# Patient Record
Sex: Female | Born: 1960 | Race: White | Hispanic: No | Smoking: Former smoker
Health system: Southern US, Community
[De-identification: ages and names within clinical notes are randomized; demographics above are authoritative.]

## PROBLEM LIST (undated history)

## (undated) DIAGNOSIS — I10 Essential (primary) hypertension: Secondary | ICD-10-CM

## (undated) DIAGNOSIS — G4730 Sleep apnea, unspecified: Secondary | ICD-10-CM

## (undated) DIAGNOSIS — M5126 Other intervertebral disc displacement, lumbar region: Secondary | ICD-10-CM

## (undated) DIAGNOSIS — E1140 Type 2 diabetes mellitus with diabetic neuropathy, unspecified: Secondary | ICD-10-CM

## (undated) DIAGNOSIS — J309 Allergic rhinitis, unspecified: Secondary | ICD-10-CM

## (undated) DIAGNOSIS — F419 Anxiety disorder, unspecified: Secondary | ICD-10-CM

## (undated) DIAGNOSIS — D509 Iron deficiency anemia, unspecified: Secondary | ICD-10-CM

## (undated) HISTORY — PX: NASAL SINUS SURGERY: SHX719

---

## 2008-02-12 ENCOUNTER — Ambulatory Visit: Payer: Self-pay | Admitting: Internal Medicine

## 2010-06-19 ENCOUNTER — Ambulatory Visit: Payer: Self-pay | Admitting: Anesthesiology

## 2010-06-21 ENCOUNTER — Ambulatory Visit: Payer: Self-pay | Admitting: Otolaryngology

## 2010-06-22 LAB — PATHOLOGY REPORT

## 2017-03-11 ENCOUNTER — Other Ambulatory Visit: Payer: Self-pay | Admitting: Cardiology

## 2017-03-11 DIAGNOSIS — R0602 Shortness of breath: Secondary | ICD-10-CM

## 2017-03-11 DIAGNOSIS — R0789 Other chest pain: Secondary | ICD-10-CM

## 2017-04-08 ENCOUNTER — Other Ambulatory Visit: Payer: Self-pay | Admitting: Cardiology

## 2017-04-08 DIAGNOSIS — R0602 Shortness of breath: Secondary | ICD-10-CM

## 2017-04-08 DIAGNOSIS — R0789 Other chest pain: Secondary | ICD-10-CM

## 2017-04-11 ENCOUNTER — Encounter
Admission: RE | Admit: 2017-04-11 | Discharge: 2017-04-11 | Disposition: A | Discharge status: Home / Self Care | Payer: Managed Care, Other (non HMO) | Source: Ambulatory Visit | Attending: Cardiology | Admitting: Cardiology

## 2017-04-11 DIAGNOSIS — R0602 Shortness of breath: Secondary | ICD-10-CM | POA: Diagnosis present

## 2017-04-11 DIAGNOSIS — R0789 Other chest pain: Secondary | ICD-10-CM | POA: Insufficient documentation

## 2017-04-11 MED ORDER — TECHNETIUM TC 99M TETROFOSMIN IV KIT
30.0000 | PACK | Freq: Once | INTRAVENOUS | Status: AC | PRN
  Administered 2017-04-11: 29.55 via INTRAVENOUS

## 2017-04-11 MED ORDER — REGADENOSON 0.4 MG/5ML IV SOLN
0.4000 mg | Freq: Once | INTRAVENOUS | Status: AC
  Administered 2017-04-11: 0.4 mg via INTRAVENOUS
  Filled 2017-04-11: qty 5

## 2017-04-12 ENCOUNTER — Encounter
Admission: RE | Admit: 2017-04-12 | Discharge: 2017-04-12 | Disposition: A | Discharge status: Home / Self Care | Payer: Managed Care, Other (non HMO) | Source: Ambulatory Visit | Attending: Cardiology | Admitting: Cardiology

## 2017-04-12 DIAGNOSIS — R0602 Shortness of breath: Secondary | ICD-10-CM | POA: Diagnosis not present

## 2017-04-12 LAB — NM MYOCAR MULTI W/SPECT W/WALL MOTION / EF
CHL CUP NUCLEAR SDS: 1
CHL CUP NUCLEAR SSS: 7
Estimated workload: 1 METS
Exercise duration (min): 1 min
Exercise duration (sec): 47 s
LV dias vol: 126 mL (ref 46–106)
LV sys vol: 29 mL
NUC STRESS TID: 0.85
Peak HR: 108 {beats}/min
Rest HR: 79 {beats}/min
SRS: 10

## 2017-04-12 MED ORDER — TECHNETIUM TC 99M TETROFOSMIN IV KIT
30.0600 | PACK | Freq: Once | INTRAVENOUS | Status: AC | PRN
  Administered 2017-04-12: 30.06 via INTRAVENOUS

## 2017-05-10 ENCOUNTER — Other Ambulatory Visit: Payer: Self-pay | Admitting: Nurse Practitioner

## 2017-05-10 DIAGNOSIS — Z1231 Encounter for screening mammogram for malignant neoplasm of breast: Secondary | ICD-10-CM

## 2017-05-29 ENCOUNTER — Ambulatory Visit
Admission: RE | Admit: 2017-05-29 | Discharge: 2017-05-29 | Disposition: A | Discharge status: Home / Self Care | Payer: Managed Care, Other (non HMO) | Source: Ambulatory Visit | Attending: Nurse Practitioner | Admitting: Nurse Practitioner

## 2017-05-29 DIAGNOSIS — Z1231 Encounter for screening mammogram for malignant neoplasm of breast: Secondary | ICD-10-CM | POA: Insufficient documentation

## 2017-09-17 ENCOUNTER — Encounter
Admission: RE | Disposition: A | Discharge status: Home / Self Care | Payer: Self-pay | Source: Ambulatory Visit | Attending: Internal Medicine

## 2017-09-17 ENCOUNTER — Ambulatory Visit: Payer: Managed Care, Other (non HMO) | Admitting: Anesthesiology

## 2017-09-17 ENCOUNTER — Ambulatory Visit
Admission: RE | Admit: 2017-09-17 | Discharge: 2017-09-17 | Disposition: A | Discharge status: Home / Self Care | Payer: Managed Care, Other (non HMO) | Source: Ambulatory Visit | Attending: Internal Medicine | Admitting: Internal Medicine

## 2017-09-17 ENCOUNTER — Other Ambulatory Visit: Payer: Self-pay

## 2017-09-17 ENCOUNTER — Encounter: Payer: Self-pay | Admitting: *Deleted

## 2017-09-17 DIAGNOSIS — Z7984 Long term (current) use of oral hypoglycemic drugs: Secondary | ICD-10-CM | POA: Insufficient documentation

## 2017-09-17 DIAGNOSIS — I10 Essential (primary) hypertension: Secondary | ICD-10-CM | POA: Diagnosis not present

## 2017-09-17 DIAGNOSIS — Z79899 Other long term (current) drug therapy: Secondary | ICD-10-CM | POA: Diagnosis not present

## 2017-09-17 DIAGNOSIS — Z6841 Body Mass Index (BMI) 40.0 and over, adult: Secondary | ICD-10-CM | POA: Diagnosis not present

## 2017-09-17 DIAGNOSIS — G4730 Sleep apnea, unspecified: Secondary | ICD-10-CM | POA: Diagnosis not present

## 2017-09-17 DIAGNOSIS — K5730 Diverticulosis of large intestine without perforation or abscess without bleeding: Secondary | ICD-10-CM | POA: Diagnosis not present

## 2017-09-17 DIAGNOSIS — Z1211 Encounter for screening for malignant neoplasm of colon: Secondary | ICD-10-CM | POA: Diagnosis present

## 2017-09-17 DIAGNOSIS — F419 Anxiety disorder, unspecified: Secondary | ICD-10-CM | POA: Diagnosis not present

## 2017-09-17 DIAGNOSIS — Z882 Allergy status to sulfonamides status: Secondary | ICD-10-CM | POA: Insufficient documentation

## 2017-09-17 DIAGNOSIS — E1140 Type 2 diabetes mellitus with diabetic neuropathy, unspecified: Secondary | ICD-10-CM | POA: Insufficient documentation

## 2017-09-17 DIAGNOSIS — D128 Benign neoplasm of rectum: Secondary | ICD-10-CM | POA: Insufficient documentation

## 2017-09-17 DIAGNOSIS — K640 First degree hemorrhoids: Secondary | ICD-10-CM | POA: Insufficient documentation

## 2017-09-17 HISTORY — DX: Morbid (severe) obesity due to excess calories: E66.01

## 2017-09-17 HISTORY — DX: Essential (primary) hypertension: I10

## 2017-09-17 HISTORY — DX: Sleep apnea, unspecified: G47.30

## 2017-09-17 HISTORY — DX: Allergic rhinitis, unspecified: J30.9

## 2017-09-17 HISTORY — DX: Iron deficiency anemia, unspecified: D50.9

## 2017-09-17 HISTORY — PX: COLONOSCOPY WITH PROPOFOL: SHX5780

## 2017-09-17 HISTORY — DX: Other intervertebral disc displacement, lumbar region: M51.26

## 2017-09-17 HISTORY — DX: Anxiety disorder, unspecified: F41.9

## 2017-09-17 HISTORY — DX: Type 2 diabetes mellitus with diabetic neuropathy, unspecified: E11.40

## 2017-09-17 LAB — GLUCOSE, CAPILLARY: GLUCOSE-CAPILLARY: 149 mg/dL — AB (ref 65–99)

## 2017-09-17 SURGERY — COLONOSCOPY WITH PROPOFOL
Anesthesia: General

## 2017-09-17 MED ORDER — PHENYLEPHRINE HCL 10 MG/ML IJ SOLN
INTRAMUSCULAR | Status: AC
  Filled 2017-09-17: qty 1

## 2017-09-17 MED ORDER — PROPOFOL 500 MG/50ML IV EMUL
INTRAVENOUS | Status: AC
  Filled 2017-09-17: qty 100

## 2017-09-17 MED ORDER — SODIUM CHLORIDE 0.9 % IV SOLN
INTRAVENOUS | Status: DC
  Administered 2017-09-17: 08:00:00 via INTRAVENOUS

## 2017-09-17 MED ORDER — EPHEDRINE SULFATE 50 MG/ML IJ SOLN
INTRAMUSCULAR | Status: AC
  Filled 2017-09-17: qty 1

## 2017-09-17 MED ORDER — PROPOFOL 10 MG/ML IV BOLUS
INTRAVENOUS | Status: DC | PRN
  Administered 2017-09-17: 70 mg via INTRAVENOUS
  Administered 2017-09-17: 20 mg via INTRAVENOUS

## 2017-09-17 MED ORDER — LIDOCAINE HCL (PF) 2 % IJ SOLN
INTRAMUSCULAR | Status: AC
  Filled 2017-09-17: qty 20

## 2017-09-17 MED ORDER — PROPOFOL 500 MG/50ML IV EMUL
INTRAVENOUS | Status: DC | PRN
  Administered 2017-09-17: 140 ug/kg/min via INTRAVENOUS

## 2017-09-17 MED ORDER — PROPOFOL 500 MG/50ML IV EMUL
INTRAVENOUS | Status: AC
  Filled 2017-09-17: qty 150

## 2017-09-17 MED ORDER — LIDOCAINE HCL (CARDIAC) 20 MG/ML IV SOLN
INTRAVENOUS | Status: DC | PRN
  Administered 2017-09-17: 40 mg via INTRAVENOUS

## 2017-09-17 NOTE — Interval H&P Note (Signed)
History and Physical Interval Note:  09/17/2017 8:05 AM  Joan Moody  has presented today for surgery, with the diagnosis of MICROCYTIC HYPOCHRONIC  ANEMIA  The various methods of treatment have been discussed with the patient and family. After consideration of risks, benefits and other options for treatment, the patient has consented to  Procedure(s): COLONOSCOPY WITH PROPOFOL (N/A) as a surgical intervention .  The patient's history has been reviewed, patient examined, no change in status, stable for surgery.  I have reviewed the patient's chart and labs.  Questions were answered to the patient's satisfaction.     [place], [place]

## 2017-09-17 NOTE — Transfer of Care (Signed)
Immediate Anesthesia Transfer of Care Note  Patient: Joan Moody  Procedure(s) Performed: COLONOSCOPY WITH PROPOFOL (N/A )  Patient Location: PACU  Anesthesia Type:General  Level of Consciousness: awake, alert  and oriented  Airway & Oxygen Therapy: Patient Spontanous Breathing  Post-op Assessment: Report given to RN and Post -op Vital signs reviewed and stable  Post vital signs: Reviewed and stable  Last Vitals:  Vitals:   09/17/17 0712 09/17/17 0840  BP: 137/73 102/61  Pulse: 72 61  Resp: 18 14  Temp: (!) 36.3 C (!) 35.8 C  SpO2: 98% 98%    Last Pain:  Vitals:   09/17/17 0712  TempSrc: Tympanic      Patients Stated Pain Goal: 7 (91/91/66 0600)  Complications: No apparent anesthesia complications

## 2017-09-17 NOTE — Anesthesia Preprocedure Evaluation (Addendum)
Anesthesia Evaluation  Patient identified by MRN, date of birth, ID band Patient awake    Reviewed: Allergy & Precautions, NPO status , Patient's Chart, lab work & pertinent test results  Airway Mallampati: IV  TM Distance: <3 FB     Dental   Pulmonary sleep apnea and Continuous Positive Airway Pressure Ventilation , former smoker,    Pulmonary exam normal        Cardiovascular hypertension, Normal cardiovascular exam     Neuro/Psych Anxiety    GI/Hepatic negative GI ROS, Neg liver ROS,   Endo/Other  diabetes, Well Controlled, Type 2, Oral Hypoglycemic AgentsMorbid obesity  Renal/GU negative Renal ROS  negative genitourinary   Musculoskeletal negative musculoskeletal ROS (+)   Abdominal (+) + obese,   Peds negative pediatric ROS (+)  Hematology  (+) anemia ,   Anesthesia Other Findings Past Medical History: No date: Allergic rhinitis No date: Anxiety No date: Diabetic neuropathy (HCC) No date: Hypertension No date: Lumbar disc herniation No date: Microcytic hypochromic anemia No date: Morbid obesity ([place]) No date: Sleep apnea  Reproductive/Obstetrics                            Anesthesia Physical Anesthesia Plan  ASA: III  Anesthesia Plan: General   Post-op Pain Management:    Induction: Intravenous  PONV Risk Score and Plan:   Airway Management Planned: Nasal Cannula  Additional Equipment:   Intra-op Plan:   Post-operative Plan:   Informed Consent: I have reviewed the patients History and Physical, chart, labs and discussed the procedure including the risks, benefits and alternatives for the proposed anesthesia with the patient or authorized representative who has indicated his/her understanding and acceptance.   Dental advisory given  Plan Discussed with: CRNA and Surgeon  Anesthesia Plan Comments:         Anesthesia Quick Evaluation

## 2017-09-17 NOTE — H&P (Signed)
Outpatient short stay form Pre-procedure 09/17/2017 8:04 AM Teodoro K. Alice Reichert, M.D.  Primary Physician: Gaetano Net, NP  Reason for visit:  Colon cancer screening  History of present illness:  Patient presents for colonoscopy for screening/surveillance. The patient denies complaints of abdominal pain, significant change in bowel habits, or rectal bleeding.     Current Facility-Administered Medications:  .  0.9 %  sodium chloride infusion, , Intravenous, Continuous, [place], Benay Pike, MD, Last Rate: 20 mL/hr at 09/17/17 0730  Medications Prior to Admission  Medication Sig Dispense Refill Last Dose  . chlorthalidone (HYGROTON) 25 MG tablet Take 25 mg by mouth daily.   09/17/2017 at 0500  . diphenhydrAMINE (BENADRYL) 25 mg capsule Take 25 mg by mouth 2 (two) times daily before a meal.   09/17/2017 at 0500  . ferrous sulfate 325 (65 FE) MG EC tablet Take 325 mg by mouth daily with breakfast.   Past Week at Unknown time  . gabapentin (NEURONTIN) 100 MG capsule Take 100 mg by mouth 2 (two) times daily.   09/16/2017 at 1915  . glimepiride (AMARYL) 1 MG tablet Take 1 mg by mouth daily with breakfast.   Past Week at Unknown time  . lovastatin (MEVACOR) 10 MG tablet Take 10 mg by mouth at bedtime.   09/16/2017 at 1915  . metFORMIN (GLUCOPHAGE) 500 MG tablet Take by mouth 3 (three) times daily with meals.   Past Week at Unknown time  . potassium chloride SA (K-DUR,KLOR-CON) 20 MEQ tablet Take 20 mEq by mouth daily.   09/17/2017 at 0500  . amoxicillin (AMOXIL) 500 MG capsule Take 500 mg by mouth 2 (two) times daily.   Completed Course at Unknown time     Allergies  Allergen Reactions  . Actos [Pioglitazone]   . Shellfish Allergy   . Sulfa Antibiotics      Past Medical History:  Diagnosis Date  . Allergic rhinitis   . Anxiety   . Diabetic neuropathy ([place])   . Hypertension   . Lumbar disc herniation   . Microcytic hypochromic anemia   . Morbid obesity ([place])   . Sleep apnea     Review of  systems:      Physical Exam  Gen: Alert, oriented. Appears stated age.  HEENT: [place]/AT. PERRLA. Lungs: CTA, no wheezes. CV: RR nl S1, S2. Abd: soft, benign, no masses. BS+ Ext: No edema. Pulses 2+    Planned procedures: Colonoscopy. The patient understands the nature of the planned procedure, indications, risks, alternatives and potential complications including but not limited to bleeding, infection, perforation, damage to internal organs and possible oversedation/side effects from anesthesia. The patient agrees and gives consent to proceed.  Please refer to procedure notes for findings, recommendations and patient disposition/instructions.    Teodoro K. Alice Reichert, M.D. Gastroenterology 09/17/2017  8:04 AM

## 2017-09-17 NOTE — Anesthesia Postprocedure Evaluation (Signed)
Anesthesia Post Note  Patient: Joan Moody  Procedure(s) Performed: COLONOSCOPY WITH PROPOFOL (N/A )  Patient location during evaluation: PACU Anesthesia Type: General Level of consciousness: awake and alert and oriented Pain management: pain level controlled Vital Signs Assessment: post-procedure vital signs reviewed and stable Respiratory status: spontaneous breathing Cardiovascular status: blood pressure returned to baseline Anesthetic complications: no     Last Vitals:  Vitals:   09/17/17 0900 09/17/17 0910  BP: 111/74 116/68  Pulse: 60 (!) 59  Resp: (!) 22 19  Temp:    SpO2: 98% 98%    Last Pain:  Vitals:   09/17/17 0712  TempSrc: Tympanic                 Kaemon Barnett

## 2017-09-17 NOTE — Op Note (Signed)
[facility] Gastroenterology Patient Name: Joan Moody Procedure Date: 09/17/2017 8:05 AM MRN: [id number] Account #: [id number] Date of Birth: [birth year] Admit Type: Outpatient Age: 57 Room: [facility] ENDO ROOM 2 Gender: Female Note Status: Finalized Procedure:            Colonoscopy Indications:          Screening for colorectal malignant neoplasm Providers:            Benay Pike. Alice Reichert MD, MD Referring MD:         Christena Flake. Raechel Ache, MD (Referring MD) Medicines:            Propofol per Anesthesia Complications:        No immediate complications. Procedure:            Pre-Anesthesia Assessment:                       - The risks and benefits of the procedure and the                        sedation options and risks were discussed with the                        patient. All questions were answered and informed                        consent was obtained.                       - Patient identification and proposed procedure were                        verified prior to the procedure by the nurse. The                        procedure was verified in the procedure room.                       - ASA Grade Assessment: III - A patient with severe                        systemic disease.                       - After reviewing the risks and benefits, the patient                        was deemed in satisfactory condition to undergo the                        procedure.                       After obtaining informed consent, the colonoscope was                        passed under direct vision. Throughout the procedure,                        the patient's blood pressure, pulse, and oxygen  saturations were monitored continuously. The                        Colonoscope was introduced through the anus and                        advanced to the the terminal ileum, with identification                        of the appendiceal orifice and IC valve. The                colonoscopy was performed without difficulty. The                        patient tolerated the procedure well. Findings:      The perianal and digital rectal examinations were normal. Pertinent       negatives include normal sphincter tone and no palpable rectal lesions.      Many small-mouthed diverticula were found in the sigmoid colon. There       was no evidence of diverticular bleeding.      A 13 mm polyp was found in the rectum. The polyp was semi-pedunculated.       The polyp was removed with a hot snare. Resection and retrieval were       complete.      Non-bleeding internal hemorrhoids were found during retroflexion. The       hemorrhoids were Grade I (internal hemorrhoids that do not prolapse).      The exam was otherwise without abnormality. Impression:           - Mild diverticulosis in the sigmoid colon. There was                        no evidence of diverticular bleeding.                       - One 13 mm polyp in the rectum, removed with a hot                        snare. Resected and retrieved.                       - Non-bleeding internal hemorrhoids.                       - The examination was otherwise normal. Recommendation:       - Patient has a contact number available for                        emergencies. The signs and symptoms of potential                        delayed complications were discussed with the patient.                        Return to normal activities tomorrow. Written discharge                        instructions were provided to the patient.                       -  Resume previous diet.                       - Continue present medications.                       - Await pathology results.                       - Repeat colonoscopy is recommended for surveillance.                        The colonoscopy date will be determined after pathology                        results from today's exam become available for review.                        - Return to GI office PRN.                       - The findings and recommendations were discussed with                        the patient and their family. Procedure Code(s):    --- Professional ---                       [phone], Colonoscopy, flexible; with removal of tumor(s),                        polyp(s), or other lesion(s) by snare technique Diagnosis Code(s):    --- Professional ---                       K57.30, Diverticulosis of large intestine without                        perforation or abscess without bleeding                       K62.1, Rectal polyp                       K64.0, First degree hemorrhoids                       Z12.11, Encounter for screening for malignant neoplasm                        of colon CPT copyright 2016 American Medical Association. All rights reserved. The codes documented in this report are preliminary and upon coder review may  be revised to meet current compliance requirements. Efrain Sella MD, MD 09/17/2017 8:34:26 AM This report has been signed electronically. Number of Addenda: 0 Note Initiated On: 09/17/2017 8:05 AM Scope Withdrawal Time: 0 hours 12 minutes 22 seconds  Total Procedure Duration: 0 hours 18 minutes 13 seconds       [facility]

## 2017-09-17 NOTE — Anesthesia Post-op Follow-up Note (Signed)
Anesthesia QCDR form completed.        

## 2017-09-18 ENCOUNTER — Encounter: Payer: Self-pay | Admitting: Internal Medicine

## 2017-09-18 LAB — SURGICAL PATHOLOGY

## 2018-04-30 ENCOUNTER — Other Ambulatory Visit: Payer: Self-pay | Admitting: Nurse Practitioner

## 2018-04-30 DIAGNOSIS — Z1231 Encounter for screening mammogram for malignant neoplasm of breast: Secondary | ICD-10-CM

## 2018-05-30 ENCOUNTER — Ambulatory Visit
Admission: RE | Admit: 2018-05-30 | Discharge: 2018-05-30 | Disposition: A | Discharge status: Home / Self Care | Payer: Managed Care, Other (non HMO) | Source: Ambulatory Visit | Attending: Nurse Practitioner | Admitting: Nurse Practitioner

## 2018-05-30 DIAGNOSIS — Z1231 Encounter for screening mammogram for malignant neoplasm of breast: Secondary | ICD-10-CM | POA: Diagnosis not present

## 2019-07-06 ENCOUNTER — Other Ambulatory Visit: Payer: Self-pay | Admitting: Nurse Practitioner

## 2019-07-06 DIAGNOSIS — Z1231 Encounter for screening mammogram for malignant neoplasm of breast: Secondary | ICD-10-CM

## 2019-08-17 ENCOUNTER — Ambulatory Visit
Admission: RE | Admit: 2019-08-17 | Discharge: 2019-08-17 | Disposition: A | Discharge status: Home / Self Care | Payer: Managed Care, Other (non HMO) | Source: Ambulatory Visit | Attending: Nurse Practitioner | Admitting: Nurse Practitioner

## 2019-08-17 DIAGNOSIS — Z1231 Encounter for screening mammogram for malignant neoplasm of breast: Secondary | ICD-10-CM | POA: Insufficient documentation

## 2020-01-26 ENCOUNTER — Inpatient Hospital Stay: Payer: Managed Care, Other (non HMO) | Attending: Oncology | Admitting: Oncology

## 2020-01-26 ENCOUNTER — Other Ambulatory Visit: Payer: Self-pay

## 2020-01-26 ENCOUNTER — Inpatient Hospital Stay: Payer: Managed Care, Other (non HMO)

## 2020-01-26 ENCOUNTER — Encounter: Payer: Self-pay | Admitting: Oncology

## 2020-01-26 VITALS — BP 109/74 | HR 56 | Temp 99.2°F | Resp 18 | Ht 65.0 in | Wt 304.7 lb

## 2020-01-26 DIAGNOSIS — D509 Iron deficiency anemia, unspecified: Secondary | ICD-10-CM | POA: Insufficient documentation

## 2020-01-26 DIAGNOSIS — E1140 Type 2 diabetes mellitus with diabetic neuropathy, unspecified: Secondary | ICD-10-CM | POA: Diagnosis not present

## 2020-01-26 DIAGNOSIS — F419 Anxiety disorder, unspecified: Secondary | ICD-10-CM | POA: Diagnosis not present

## 2020-01-26 DIAGNOSIS — Z79899 Other long term (current) drug therapy: Secondary | ICD-10-CM | POA: Insufficient documentation

## 2020-01-26 DIAGNOSIS — R5383 Other fatigue: Secondary | ICD-10-CM | POA: Insufficient documentation

## 2020-01-26 DIAGNOSIS — G4730 Sleep apnea, unspecified: Secondary | ICD-10-CM | POA: Diagnosis not present

## 2020-01-26 DIAGNOSIS — Z87891 Personal history of nicotine dependence: Secondary | ICD-10-CM | POA: Insufficient documentation

## 2020-01-26 DIAGNOSIS — E669 Obesity, unspecified: Secondary | ICD-10-CM | POA: Insufficient documentation

## 2020-01-26 DIAGNOSIS — Z7984 Long term (current) use of oral hypoglycemic drugs: Secondary | ICD-10-CM | POA: Diagnosis not present

## 2020-01-26 DIAGNOSIS — D649 Anemia, unspecified: Secondary | ICD-10-CM | POA: Insufficient documentation

## 2020-01-26 DIAGNOSIS — D72829 Elevated white blood cell count, unspecified: Secondary | ICD-10-CM | POA: Diagnosis not present

## 2020-01-26 DIAGNOSIS — I10 Essential (primary) hypertension: Secondary | ICD-10-CM | POA: Diagnosis not present

## 2020-01-26 LAB — HEPATITIS PANEL, ACUTE
HCV Ab: NONREACTIVE
Hep A IgM: NONREACTIVE
Hep B C IgM: NONREACTIVE
Hepatitis B Surface Ag: NONREACTIVE

## 2020-01-26 LAB — COMPREHENSIVE METABOLIC PANEL
ALT: 24 U/L (ref 0–44)
AST: 42 U/L — ABNORMAL HIGH (ref 15–41)
Albumin: 3.8 g/dL (ref 3.5–5.0)
Alkaline Phosphatase: 85 U/L (ref 38–126)
Anion gap: 10 (ref 5–15)
BUN: 10 mg/dL (ref 6–20)
CO2: 32 mmol/L (ref 22–32)
Calcium: 8.9 mg/dL (ref 8.9–10.3)
Chloride: 98 mmol/L (ref 98–111)
Creatinine, Ser: 0.81 mg/dL (ref 0.44–1.00)
GFR calc Af Amer: >60 mL/min (ref >60)
GFR calc non Af Amer: >60 mL/min (ref >60)
Glucose, Bld: 124 mg/dL — ABNORMAL HIGH (ref 70–99)
Potassium: 3.3 mmol/L — ABNORMAL LOW (ref 3.5–5.1)
Sodium: 140 mmol/L (ref 135–145)
Total Bilirubin: 0.5 mg/dL (ref 0.3–1.2)
Total Protein: 8.2 g/dL — ABNORMAL HIGH (ref 6.5–8.1)

## 2020-01-26 LAB — CBC WITH DIFFERENTIAL/PLATELET
Abs Immature Granulocytes: 0.14 10*3/uL — ABNORMAL HIGH (ref 0.00–0.07)
Basophils Absolute: 0 10*3/uL (ref 0.0–0.1)
Basophils Relative: 0 %
Eosinophils Absolute: 0.2 10*3/uL (ref 0.0–0.5)
Eosinophils Relative: 1 %
HCT: 36.8 % (ref 36.0–46.0)
Hemoglobin: 11.6 g/dL — ABNORMAL LOW (ref 12.0–15.0)
Immature Granulocytes: 1 %
Lymphocytes Relative: 17 %
Lymphs Abs: 2.5 10*3/uL (ref 0.7–4.0)
MCH: 25.2 pg — ABNORMAL LOW (ref 26.0–34.0)
MCHC: 31.5 g/dL (ref 30.0–36.0)
MCV: 79.8 fL — ABNORMAL LOW (ref 80.0–100.0)
Monocytes Absolute: 0.5 10*3/uL (ref 0.1–1.0)
Monocytes Relative: 3 %
Neutro Abs: 11.6 10*3/uL — ABNORMAL HIGH (ref 1.7–7.7)
Neutrophils Relative %: 78 %
Platelets: 289 10*3/uL (ref 150–400)
RBC: 4.61 MIL/uL (ref 3.87–5.11)
RDW: 15.4 % (ref 11.5–15.5)
WBC: 15 10*3/uL — ABNORMAL HIGH (ref 4.0–10.5)
nRBC: 0 % (ref 0.0–0.2)

## 2020-01-26 LAB — HIV ANTIBODY (ROUTINE TESTING W REFLEX): HIV Screen 4th Generation wRfx: NONREACTIVE

## 2020-01-26 LAB — TECHNOLOGIST SMEAR REVIEW: Tech Review: ADEQUATE

## 2020-01-26 LAB — TSH: TSH: 1.996 u[IU]/mL (ref 0.350–4.500)

## 2020-01-26 NOTE — Progress Notes (Signed)
Hematology/Oncology Consult note [facility] [phone] [phone]   Patient Care Team: Ezequiel Kayser, MD as PCP - General (Internal Medicine)  REFERRING PROVIDER: Ezequiel Kayser, MD  CHIEF COMPLAINTS/REASON FOR VISIT:  Evaluation of leukocytosis  HISTORY OF PRESENTING ILLNESS:  Joan Moody is a  59 y.o.  female with PMH listed below who was referred to me for evaluation of leukocytosis Reviewed patient' recent labs obtained by PCP.   01/14/2020 CBC showed elevated white count of 17.6, hemoglobin 11.5, hematocrit 37.3, MCV 83.6, platelet 260.  Differential showed neutrophilia. Previous lab records reviewed. Leukocytosis onset of chronic, duration is since at least 2016.  No aggravating or elevated factors. Associated symptoms or signs:  Denies weight loss, fever, chills,night sweats.  Some fatigue Smoking history: Former smoker.  Quitted in 2011. History of recent oral steroid use or steroid injection: Denies History of recent infection: Denies Autoimmune disease history.  Denies  Colonoscopy and mammogram up-to-date. Family history includes maternal aunt deceased from ovarian cancer. Maternal aunt deceased from colon cancer.  Review of Systems  Constitutional: Negative for appetite change, chills, fatigue and fever.  HENT:   Negative for hearing loss and voice change.   Eyes: Negative for eye problems.  Respiratory: Negative for chest tightness and cough.   Cardiovascular: Negative for chest pain.  Gastrointestinal: Negative for abdominal distention, abdominal pain and blood in stool.  Endocrine: Negative for hot flashes.  Genitourinary: Negative for difficulty urinating and frequency.   Musculoskeletal: Negative for arthralgias.  Skin: Negative for itching and rash.  Neurological: Negative for extremity weakness.  Hematological: Negative for adenopathy.  Psychiatric/Behavioral: Negative for confusion.     MEDICAL HISTORY:    Past Medical History:  Diagnosis Date  . Allergic rhinitis   . Anxiety   . Diabetic neuropathy ([place])   . Hypertension   . Lumbar disc herniation   . Microcytic hypochromic anemia   . Morbid obesity ([place])   . Sleep apnea     SURGICAL HISTORY: Past Surgical History:  Procedure Laterality Date  . CESAREAN SECTION    . COLONOSCOPY WITH PROPOFOL N/A 09/17/2017   Procedure: COLONOSCOPY WITH PROPOFOL;  Surgeon: [place], Benay Pike, MD;  Location: [facility] ENDOSCOPY;  Service: Gastroenterology;  Laterality: N/A;  . NASAL SINUS SURGERY      SOCIAL HISTORY: Social History   Socioeconomic History  . Marital status: Married    Spouse name: Not on file  . Number of children: Not on file  . Years of education: Not on file  . Highest education level: Not on file  Occupational History  . Not on file  Tobacco Use  . Smoking status: Former Research scientist (life sciences)  . Smokeless tobacco: Never Used  Substance and Sexual Activity  . Alcohol use: Not on file  . Drug use: Not on file  . Sexual activity: Not on file  Other Topics Concern  . Not on file  Social History Narrative  . Not on file   Social Determinants of Health   Financial Resource Strain:   . Difficulty of Paying Living Expenses:   Food Insecurity:   . Worried About Charity fundraiser in the Last Year:   . Arboriculturist in the Last Year:   Transportation Needs:   . Film/video editor (Medical):   Marland Kitchen Lack of Transportation (Non-Medical):   Physical Activity:   . Days of Exercise per Week:   . Minutes of Exercise per Session:   Stress:   . Feeling of  Stress :   Social Connections:   . Frequency of Communication with Friends and Family:   . Frequency of Social Gatherings with Friends and Family:   . Attends Religious Services:   . Active Member of Clubs or Organizations:   . Attends Archivist Meetings:   Marland Kitchen Marital Status:   Intimate Partner Violence:   . Fear of Current or Ex-Partner:   . Emotionally Abused:   Marland Kitchen  Physically Abused:   . Sexually Abused:     FAMILY HISTORY: Family History  Problem Relation Age of Onset  . Breast cancer Maternal Aunt   . Breast cancer Paternal Aunt   . Breast cancer Paternal Aunt     ALLERGIES:  is allergic to actos [pioglitazone], shellfish allergy, and sulfa antibiotics.  MEDICATIONS:  Current Outpatient Medications  Medication Sig Dispense Refill  . chlorthalidone (HYGROTON) 25 MG tablet Take 25 mg by mouth daily.    . ferrous sulfate 325 (65 FE) MG EC tablet Take 325 mg by mouth daily with breakfast.    . gabapentin (NEURONTIN) 100 MG capsule Take 100 mg by mouth 2 (two) times daily.    Marland Kitchen glimepiride (AMARYL) 1 MG tablet Take 1 mg by mouth daily with breakfast.    . lovastatin (MEVACOR) 10 MG tablet Take 10 mg by mouth at bedtime.    . metFORMIN (GLUCOPHAGE) 500 MG tablet Take by mouth 3 (three) times daily with meals.    . potassium chloride SA (K-DUR,KLOR-CON) 20 MEQ tablet Take 20 mEq by mouth daily.     No current facility-administered medications for this visit.     PHYSICAL EXAMINATION: ECOG PERFORMANCE STATUS: 0 - Asymptomatic Vitals:   01/26/20 1111  BP: 109/74  Pulse: (!) 56  Resp: 18  Temp: 99.2 F (37.3 C)   Filed Weights   01/26/20 1111  Weight: (!) 304 lb 11.2 oz (138.2 kg)    Physical Exam Constitutional:      General: She is not in acute distress. HENT:     Head: Normocephalic and atraumatic.  Eyes:     General: No scleral icterus. Cardiovascular:     Rate and Rhythm: Normal rate and regular rhythm.     Heart sounds: Normal heart sounds.  Pulmonary:     Effort: Pulmonary effort is normal. No respiratory distress.     Breath sounds: No wheezing.  Abdominal:     General: Bowel sounds are normal. There is no distension.     Palpations: Abdomen is soft.  Musculoskeletal:        General: No deformity. Normal range of motion.     Cervical back: Normal range of motion and neck supple.  Skin:    General: Skin is warm and  dry.     Findings: No erythema or rash.  Neurological:     Mental Status: She is alert and oriented to person, place, and time. Mental status is at baseline.     Cranial Nerves: No cranial nerve deficit.     Coordination: Coordination normal.  Psychiatric:        Mood and Affect: Mood normal.    RADIOGRAPHIC STUDIES: I have personally reviewed the radiological images as listed and agreed with the findings in the report. No results found.  LABORATORY DATA:  I have reviewed the data as listed Lab Results  Component Value Date   WBC 15.0 (H) 01/26/2020   HGB 11.6 (L) 01/26/2020   HCT 36.8 01/26/2020   MCV 79.8 (L) 01/26/2020  PLT 289 01/26/2020   Recent Labs    01/26/20 1141  NA 140  K 3.3*  CL 98  CO2 32  GLUCOSE 124*  BUN 10  CREATININE 0.81  CALCIUM 8.9  GFRNONAA >60  GFRAA >60  PROT 8.2*  ALBUMIN 3.8  AST 42*  ALT 24  ALKPHOS 85  BILITOT 0.5   Iron/TIBC/Ferritin/ %Sat No results found for: IRON, TIBC, FERRITIN, IRONPCTSAT      ASSESSMENT & PLAN:  1. Leukocytosis, unspecified type   2. Normocytic anemia    Labs reviewed and discussed with patient that Leukocytosis, predominantly neutrophilia, can be secondary to infection, chronic inflammation, smoking, autoimmune disease, or underlying bone marrow disorders.   For the work up of patient's leukocytosis, I recommend checking CBC;CMP, LDH, pathology smear review, flowcytometry, etc.   Orders Placed This Encounter  Procedures  . HIV antibody (with reflex)    Standing Status:   Future    Number of Occurrences:   1    Standing Expiration Date:   01/25/2021  . CBC with Differential/Platelet    Standing Status:   Future    Number of Occurrences:   1    Standing Expiration Date:   01/25/2021  . Comprehensive metabolic panel    Standing Status:   Future    Number of Occurrences:   1    Standing Expiration Date:   01/25/2021  . Technologist smear review    Standing Status:   Future    Number of  Occurrences:   1    Standing Expiration Date:   01/25/2021  . Hepatitis panel, acute    Standing Status:   Future    Number of Occurrences:   1    Standing Expiration Date:   01/25/2021  . Flow cytometry panel-leukemia/lymphoma work-up    Standing Status:   Future    Number of Occurrences:   1    Standing Expiration Date:   01/25/2021  . TSH    Standing Status:   Future    Number of Occurrences:   1    Standing Expiration Date:   01/25/2021    All questions were answered. The patient knows to call the clinic with any problems questions or concerns.  Return of visit: 2 weeks to discuss labs. Thank you for this kind referral and the opportunity to participate in the care of this patient. A copy of today's note is routed to referring provider   Earlie Server, MD, PhD Hematology Oncology [facility] Pager- [phone] 01/26/2020

## 2020-01-26 NOTE — Progress Notes (Signed)
Patient here to establish care  

## 2020-01-27 ENCOUNTER — Other Ambulatory Visit: Payer: Self-pay

## 2020-01-27 DIAGNOSIS — D649 Anemia, unspecified: Secondary | ICD-10-CM

## 2020-01-27 LAB — IRON AND TIBC
Iron: 30 ug/dL (ref 28–170)
Saturation Ratios: 8 % — ABNORMAL LOW (ref 10.4–31.8)
TIBC: 363 ug/dL (ref 250–450)
UIBC: 333 ug/dL

## 2020-01-27 LAB — RETIC PANEL
Immature Retic Fract: 19.1 % — ABNORMAL HIGH (ref 2.3–15.9)
RBC.: 4.62 MIL/uL (ref 3.87–5.11)
Retic Count, Absolute: 81.3 10*3/uL (ref 19.0–186.0)
Retic Ct Pct: 1.8 % (ref 0.4–3.1)
Reticulocyte Hemoglobin: 27.1 pg — ABNORMAL LOW (ref >27.9)

## 2020-01-27 LAB — FERRITIN: Ferritin: 49 ng/mL (ref 11–307)

## 2020-01-29 LAB — COMP PANEL: LEUKEMIA/LYMPHOMA

## 2020-02-09 ENCOUNTER — Inpatient Hospital Stay (HOSPITAL_BASED_OUTPATIENT_CLINIC_OR_DEPARTMENT_OTHER): Payer: Managed Care, Other (non HMO) | Admitting: Oncology

## 2020-02-09 ENCOUNTER — Encounter: Payer: Self-pay | Admitting: Oncology

## 2020-02-09 DIAGNOSIS — D509 Iron deficiency anemia, unspecified: Secondary | ICD-10-CM

## 2020-02-09 DIAGNOSIS — D649 Anemia, unspecified: Secondary | ICD-10-CM

## 2020-02-09 DIAGNOSIS — E8809 Other disorders of plasma-protein metabolism, not elsewhere classified: Secondary | ICD-10-CM

## 2020-02-09 DIAGNOSIS — D72829 Elevated white blood cell count, unspecified: Secondary | ICD-10-CM | POA: Diagnosis not present

## 2020-02-09 DIAGNOSIS — R779 Abnormality of plasma protein, unspecified: Secondary | ICD-10-CM

## 2020-02-09 NOTE — Progress Notes (Signed)
HEMATOLOGY-ONCOLOGY TeleHEALTH VISIT PROGRESS NOTE  I connected with Joan Moody on 02/09/20 at  2:45 PM EDT by video enabled telemedicine visit and verified that I am speaking with the correct person using two identifiers. I discussed the limitations, risks, security and privacy concerns of performing an evaluation and management service by telemedicine and the availability of in-person appointments. I also discussed with the patient that there may be a patient responsible charge related to this service. The patient expressed understanding and agreed to proceed.   Other persons participating in the visit and their role in the encounter:  None  Patient's location: Home  Provider's location: office Chief Complaint: Follow-up for evaluation of leukocytosis and normocytic anemia.   INTERVAL HISTORY Joan Moody is a 59 y.o. female who has above history reviewed by me today presents for follow up visit for leukocytosis and normocytic anemia Problems and complaints are listed below:  Patient had work-up done during the interval.  Presents virtually to discuss results and management plan.  She has no new complaints. We were able to see each other via virtual visit for the first half of the visits.  Then due to technical difficulties, visit was transitioned to audio only visit.   Review of Systems  Constitutional: Negative for appetite change, chills, fatigue and fever.  HENT:   Negative for hearing loss and voice change.   Eyes: Negative for eye problems.  Respiratory: Negative for chest tightness and cough.   Cardiovascular: Negative for chest pain.  Gastrointestinal: Negative for abdominal distention, abdominal pain and blood in stool.  Endocrine: Negative for hot flashes.  Genitourinary: Negative for difficulty urinating and frequency.   Musculoskeletal: Negative for arthralgias.  Skin: Negative for itching and rash.  Neurological: Negative for extremity weakness.  Hematological:  Negative for adenopathy.  Psychiatric/Behavioral: Negative for confusion.    Past Medical History:  Diagnosis Date  . Allergic rhinitis   . Anxiety   . Diabetic neuropathy ([place])   . Hypertension   . Lumbar disc herniation   . Microcytic hypochromic anemia   . Morbid obesity ([place])   . Sleep apnea    Past Surgical History:  Procedure Laterality Date  . CESAREAN SECTION    . COLONOSCOPY WITH PROPOFOL N/A 09/17/2017   Procedure: COLONOSCOPY WITH PROPOFOL;  Surgeon: Toledo, Benay Pike, MD;  Location: [facility] ENDOSCOPY;  Service: Gastroenterology;  Laterality: N/A;  . NASAL SINUS SURGERY      Family History  Problem Relation Age of Onset  . Ovarian cancer Maternal Aunt   . COPD Mother   . COPD Father   . Heart disease Father   . Bone cancer Maternal Aunt     Social History   Socioeconomic History  . Marital status: Married    Spouse name: Not on file  . Number of children: Not on file  . Years of education: Not on file  . Highest education level: Not on file  Occupational History  . Occupation: Retired   Tobacco Use  . Smoking status: Former Smoker    Packs/day: 0.25    Types: Cigarettes    Quit date: 01/25/2010    Years since quitting: 10.0  . Smokeless tobacco: Never Used  Substance and Sexual Activity  . Alcohol use: Never  . Drug use: Never  . Sexual activity: Not on file  Other Topics Concern  . Not on file  Social History Narrative  . Not on file   Social Determinants of Health   Financial Resource Strain:   .  Difficulty of Paying Living Expenses:   Food Insecurity:   . Worried About Charity fundraiser in the Last Year:   . Arboriculturist in the Last Year:   Transportation Needs:   . Film/video editor (Medical):   Marland Kitchen Lack of Transportation (Non-Medical):   Physical Activity:   . Days of Exercise per Week:   . Minutes of Exercise per Session:   Stress:   . Feeling of Stress :   Social Connections:   . Frequency of Communication with Friends and  Family:   . Frequency of Social Gatherings with Friends and Family:   . Attends Religious Services:   . Active Member of Clubs or Organizations:   . Attends Archivist Meetings:   Marland Kitchen Marital Status:   Intimate Partner Violence:   . Fear of Current or Ex-Partner:   . Emotionally Abused:   Marland Kitchen Physically Abused:   . Sexually Abused:     Current Outpatient Medications on File Prior to Visit  Medication Sig Dispense Refill  . chlorthalidone (HYGROTON) 25 MG tablet Take 25 mg by mouth daily.    . ferrous sulfate 325 (65 FE) MG EC tablet Take 325 mg by mouth daily with breakfast.    . gabapentin (NEURONTIN) 100 MG capsule Take 100 mg by mouth 2 (two) times daily.    Marland Kitchen glimepiride (AMARYL) 1 MG tablet Take 1 mg by mouth daily with breakfast.    . lovastatin (MEVACOR) 10 MG tablet Take 10 mg by mouth at bedtime.    . metFORMIN (GLUCOPHAGE) 500 MG tablet Take by mouth in the morning, at noon, in the evening, and at bedtime.     . potassium chloride SA (K-DUR,KLOR-CON) 20 MEQ tablet Take 20 mEq by mouth daily.     No current facility-administered medications on file prior to visit.    Allergies  Allergen Reactions  . Actos [Pioglitazone]   . Shellfish Allergy   . Sulfa Antibiotics        Observations/Objective: Today's Vitals   02/09/20 1428  PainSc: 0-No pain   There is no height or weight on file to calculate BMI.  Physical Exam Neurological:     Mental Status: She is alert.     CBC    Component Value Date/Time   WBC 15.0 (H) 01/26/2020 1141   RBC 4.61 01/26/2020 1141   RBC 4.62 01/26/2020 1141   HGB 11.6 (L) 01/26/2020 1141   HCT 36.8 01/26/2020 1141   PLT 289 01/26/2020 1141   MCV 79.8 (L) 01/26/2020 1141   MCH 25.2 (L) 01/26/2020 1141   MCHC 31.5 01/26/2020 1141   RDW 15.4 01/26/2020 1141   LYMPHSABS 2.5 01/26/2020 1141   MONOABS 0.5 01/26/2020 1141   EOSABS 0.2 01/26/2020 1141   BASOSABS 0.0 01/26/2020 1141    CMP     Component Value Date/Time   NA  140 01/26/2020 1141   K 3.3 (L) 01/26/2020 1141   CL 98 01/26/2020 1141   CO2 32 01/26/2020 1141   GLUCOSE 124 (H) 01/26/2020 1141   BUN 10 01/26/2020 1141   CREATININE 0.81 01/26/2020 1141   CALCIUM 8.9 01/26/2020 1141   PROT 8.2 (H) 01/26/2020 1141   ALBUMIN 3.8 01/26/2020 1141   AST 42 (H) 01/26/2020 1141   ALT 24 01/26/2020 1141   ALKPHOS 85 01/26/2020 1141   BILITOT 0.5 01/26/2020 1141   GFRNONAA >60 01/26/2020 1141   GFRAA >60 01/26/2020 1141     Assessment and  Plan: 1. Normocytic anemia   2. Leukocytosis, unspecified type   3. Elevated blood protein   4. Iron deficiency anemia, unspecified iron deficiency anemia type     #Labs are reviewed and discussed with patient. -Iron deficiency anemia, Iron saturation 8, ferritin 49, TIBC 263 Patient has had colonoscopy 2 years ago. I recommend patient to reestablish care with gastroenterology Dr. Alice Reichert for further evaluation of IDA. Check urinalysis  #Leukocytosis, negative HIV and hepatitis panel.  Flow cytometry showed no significant immunophenotypic abnormality Smear showed basophilic stippling There is also mild increase of total protein.  I recommend patient to do additional blood work.  Multiple myeloma, light chain ratio, UA  Follow Up Instructions: To be determined   I discussed the assessment and treatment plan with the patient. The patient was provided an opportunity to ask questions and all were answered. The patient agreed with the plan and demonstrated an understanding of the instructions.  The patient was advised to call back or seek an in-person evaluation if the symptoms worsen or if the condition fails to improve as anticipated.   Earlie Server, MD 02/09/2020 9:52 PM

## 2020-02-09 NOTE — Progress Notes (Signed)
Pt contacted for Mychart visit. No new concerns voiced.  

## 2020-02-10 ENCOUNTER — Inpatient Hospital Stay: Payer: Managed Care, Other (non HMO)

## 2020-02-10 ENCOUNTER — Other Ambulatory Visit: Payer: Self-pay

## 2020-02-10 DIAGNOSIS — D72829 Elevated white blood cell count, unspecified: Secondary | ICD-10-CM

## 2020-02-10 DIAGNOSIS — R779 Abnormality of plasma protein, unspecified: Secondary | ICD-10-CM

## 2020-02-10 DIAGNOSIS — D649 Anemia, unspecified: Secondary | ICD-10-CM

## 2020-02-10 LAB — URINALYSIS, COMPLETE (UACMP) WITH MICROSCOPIC
Bilirubin Urine: NEGATIVE
Glucose, UA: NEGATIVE mg/dL
Hgb urine dipstick: NEGATIVE
Ketones, ur: NEGATIVE mg/dL
Nitrite: NEGATIVE
Protein, ur: NEGATIVE mg/dL
Specific Gravity, Urine: 1.021 (ref 1.005–1.030)
pH: 5 (ref 5.0–8.0)

## 2020-02-11 LAB — MULTIPLE MYELOMA PANEL, SERUM
Albumin SerPl Elph-Mcnc: 3.2 g/dL (ref 2.9–4.4)
Albumin/Glob SerPl: 0.9 (ref 0.7–1.7)
Alpha 1: 0.3 g/dL (ref 0.0–0.4)
Alpha2 Glob SerPl Elph-Mcnc: 1.1 g/dL — ABNORMAL HIGH (ref 0.4–1.0)
B-Globulin SerPl Elph-Mcnc: 1.2 g/dL (ref 0.7–1.3)
Gamma Glob SerPl Elph-Mcnc: 1 g/dL (ref 0.4–1.8)
Globulin, Total: 3.7 g/dL (ref 2.2–3.9)
IgA: 333 mg/dL (ref 87–352)
IgG (Immunoglobin G), Serum: 994 mg/dL (ref 586–1602)
IgM (Immunoglobulin M), Srm: 37 mg/dL (ref 26–217)
Total Protein ELP: 6.9 g/dL (ref 6.0–8.5)

## 2020-02-11 LAB — PROTEIN ELECTRO, RANDOM URINE
Albumin ELP, Urine: 52.3 %
Alpha-1-Globulin, U: 3.6 %
Alpha-2-Globulin, U: 9 %
Beta Globulin, U: 21 %
Gamma Globulin, U: 14.1 %
Total Protein, Urine: 13.6 mg/dL

## 2020-02-11 LAB — KAPPA/LAMBDA LIGHT CHAINS
Kappa free light chain: 49.7 mg/L — ABNORMAL HIGH (ref 3.3–19.4)
Kappa, lambda light chain ratio: 2 — ABNORMAL HIGH (ref 0.26–1.65)
Lambda free light chains: 24.9 mg/L (ref 5.7–26.3)

## 2020-02-12 ENCOUNTER — Other Ambulatory Visit: Payer: Managed Care, Other (non HMO)

## 2020-02-17 ENCOUNTER — Telehealth: Payer: Self-pay

## 2020-02-17 DIAGNOSIS — D649 Anemia, unspecified: Secondary | ICD-10-CM

## 2020-02-17 NOTE — Telephone Encounter (Signed)
Patient contacted and notified. She states she is currently taking ferrous sulfate daily, but will increase to BID.

## 2020-02-17 NOTE — Telephone Encounter (Signed)
Done... lab/MD in 3 months (labs prior) appt has been scheduled as requested. I was unable to reach pt by phone to make her aware of the sched appts A reminder letter will be mailed out to make her aware.

## 2020-02-17 NOTE — Telephone Encounter (Signed)
-----   Message from Earlie Server, MD sent at 02/16/2020 11:13 PM EDT ----- Please let her know that additional blood work results are fine. Elevated white blood cell counts are likely reactive, I recommend observation. For her iron deficiency anemia, recommend her to take oral ferrous sulfate twice daily.  Follow up plan lab md 3 months, labs prior. Please order cbc iron tibc ferritin.thanks.

## 2020-05-16 ENCOUNTER — Other Ambulatory Visit: Payer: Managed Care, Other (non HMO)

## 2020-05-18 ENCOUNTER — Ambulatory Visit: Payer: Managed Care, Other (non HMO) | Admitting: Oncology

## 2020-07-25 ENCOUNTER — Other Ambulatory Visit: Payer: Self-pay | Admitting: Nurse Practitioner

## 2020-07-25 DIAGNOSIS — Z1231 Encounter for screening mammogram for malignant neoplasm of breast: Secondary | ICD-10-CM

## 2020-09-01 ENCOUNTER — Ambulatory Visit
Admission: RE | Admit: 2020-09-01 | Discharge: 2020-09-01 | Disposition: A | Discharge status: Home / Self Care | Payer: Managed Care, Other (non HMO) | Source: Ambulatory Visit | Attending: Nurse Practitioner | Admitting: Nurse Practitioner

## 2020-09-01 ENCOUNTER — Other Ambulatory Visit: Payer: Self-pay

## 2020-09-01 DIAGNOSIS — Z1231 Encounter for screening mammogram for malignant neoplasm of breast: Secondary | ICD-10-CM | POA: Diagnosis not present

## 2021-08-07 ENCOUNTER — Other Ambulatory Visit: Payer: Self-pay | Admitting: Nurse Practitioner

## 2021-08-07 DIAGNOSIS — Z1231 Encounter for screening mammogram for malignant neoplasm of breast: Secondary | ICD-10-CM

## 2021-09-08 ENCOUNTER — Other Ambulatory Visit: Payer: Self-pay

## 2021-09-08 ENCOUNTER — Ambulatory Visit
Admission: RE | Admit: 2021-09-08 | Discharge: 2021-09-08 | Disposition: A | Discharge status: Home / Self Care | Payer: Managed Care, Other (non HMO) | Source: Ambulatory Visit | Attending: Nurse Practitioner | Admitting: Nurse Practitioner

## 2021-09-08 DIAGNOSIS — Z1231 Encounter for screening mammogram for malignant neoplasm of breast: Secondary | ICD-10-CM | POA: Diagnosis present

## 2022-09-24 ENCOUNTER — Other Ambulatory Visit: Payer: Self-pay | Admitting: Nurse Practitioner

## 2022-09-24 DIAGNOSIS — Z1231 Encounter for screening mammogram for malignant neoplasm of breast: Secondary | ICD-10-CM

## 2022-10-10 ENCOUNTER — Ambulatory Visit
Admission: RE | Admit: 2022-10-10 | Discharge: 2022-10-10 | Disposition: A | Discharge status: Home / Self Care | Payer: Managed Care, Other (non HMO) | Source: Ambulatory Visit | Attending: Nurse Practitioner | Admitting: Nurse Practitioner

## 2022-10-10 DIAGNOSIS — Z1231 Encounter for screening mammogram for malignant neoplasm of breast: Secondary | ICD-10-CM | POA: Diagnosis not present

## 2023-05-15 ENCOUNTER — Ambulatory Visit
Admission: RE | Admit: 2023-05-15 | Payer: Managed Care, Other (non HMO) | Source: Home / Self Care | Admitting: Internal Medicine

## 2023-05-15 SURGERY — COLONOSCOPY WITH PROPOFOL
Anesthesia: General

## 2023-07-29 ENCOUNTER — Other Ambulatory Visit: Payer: Self-pay | Admitting: Nurse Practitioner

## 2023-07-29 DIAGNOSIS — Z1231 Encounter for screening mammogram for malignant neoplasm of breast: Secondary | ICD-10-CM

## 2023-10-17 ENCOUNTER — Ambulatory Visit
Admission: RE | Admit: 2023-10-17 | Discharge: 2023-10-17 | Disposition: A | Discharge status: Home / Self Care | Payer: Managed Care, Other (non HMO) | Source: Ambulatory Visit | Attending: Nurse Practitioner | Admitting: Nurse Practitioner

## 2023-10-17 DIAGNOSIS — Z1231 Encounter for screening mammogram for malignant neoplasm of breast: Secondary | ICD-10-CM

## 2023-10-19 IMAGING — MG MM DIGITAL SCREENING BILAT W/ TOMO AND CAD
8 series · 8 of 24 positions shown · non-contrast
Comparison: Previous exam(s).

CLINICAL DATA: Screening.

EXAM:
DIGITAL SCREENING BILATERAL MAMMOGRAM WITH TOMOSYNTHESIS AND CAD
TECHNIQUE: Bilateral screening digital craniocaudal and mediolateral oblique
mammograms were obtained. Bilateral screening digital breast
tomosynthesis was performed. The images were evaluated with
computer-aided detection.

[L MLO synth-2D]
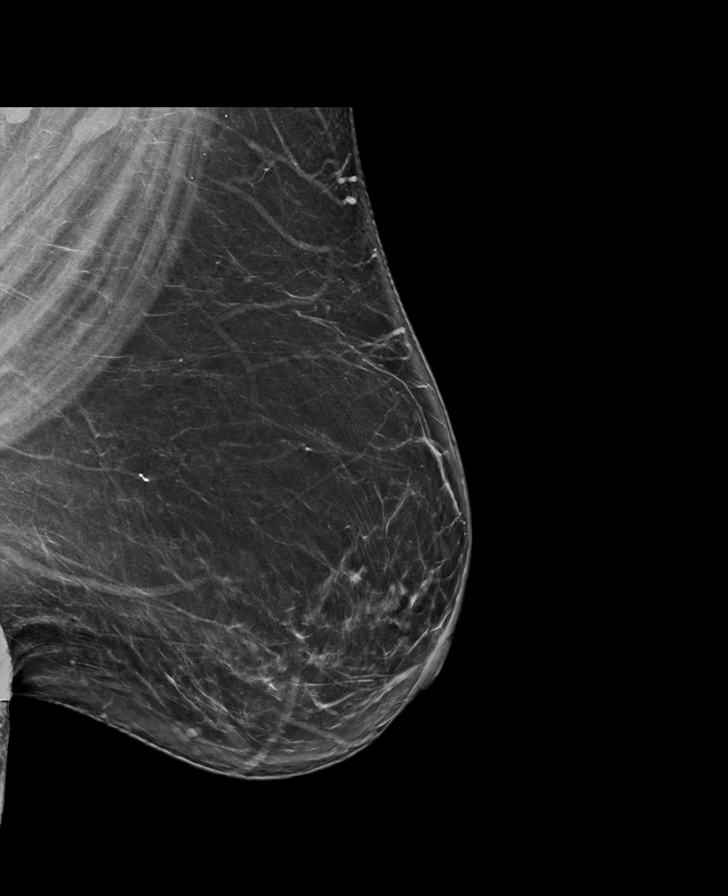

[L CC synth-2D]
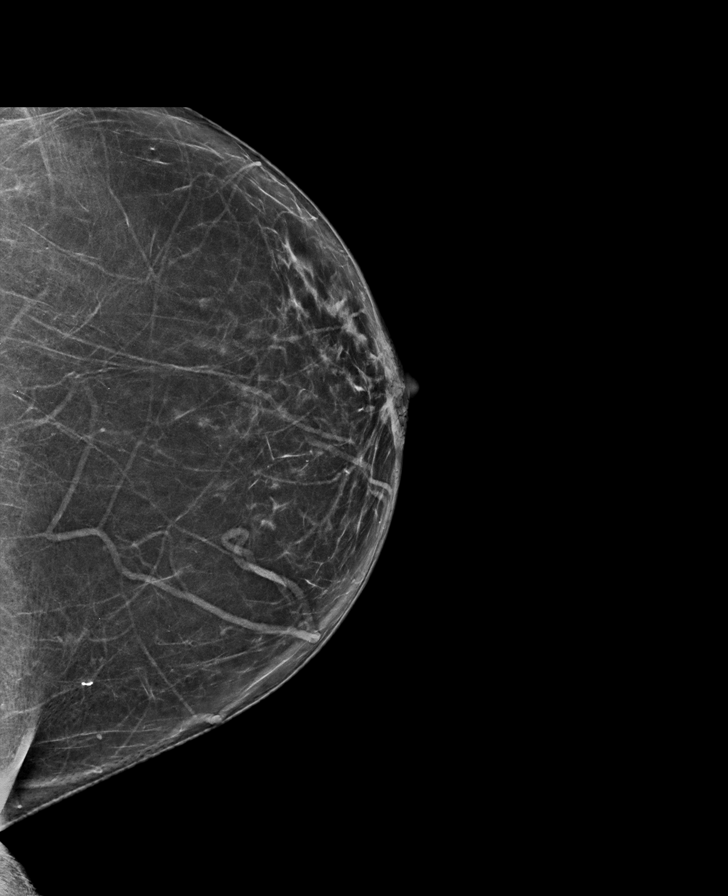

[R MLO synth-2D]
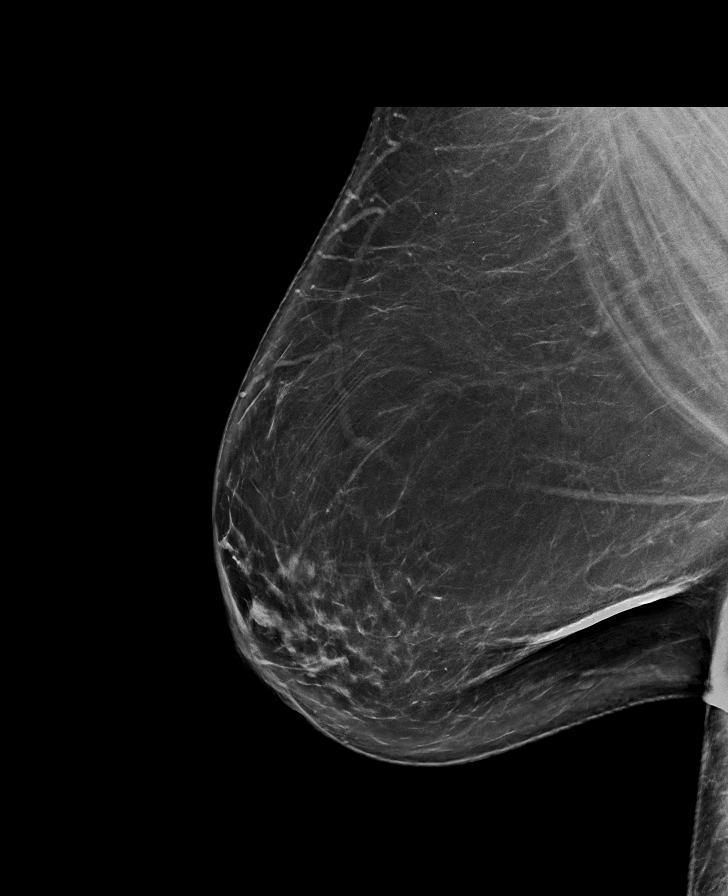

[R CC synth-2D]
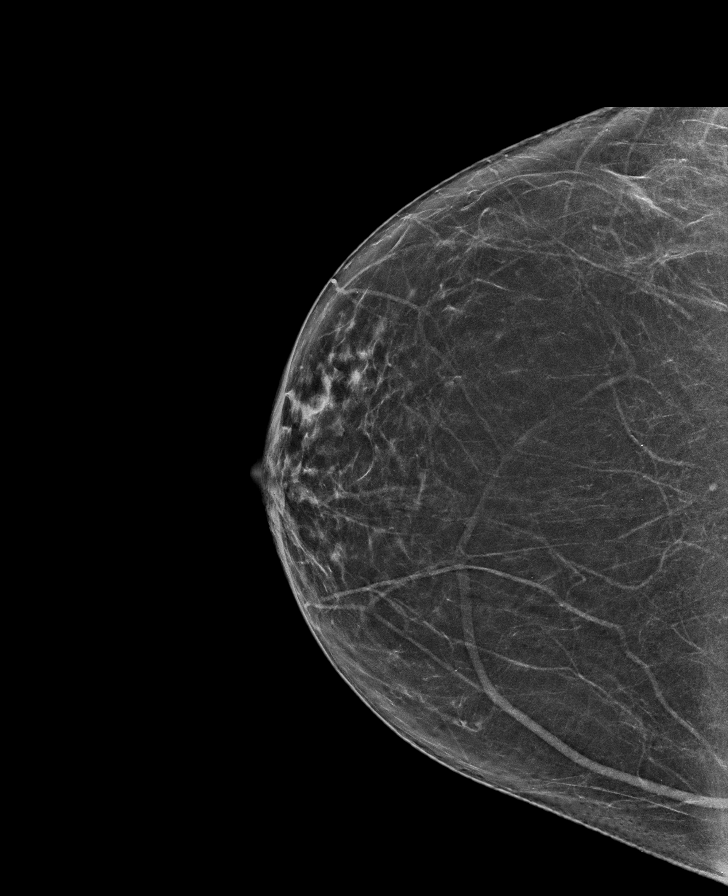

[L CC tomo · tomo slice 37/73.0]
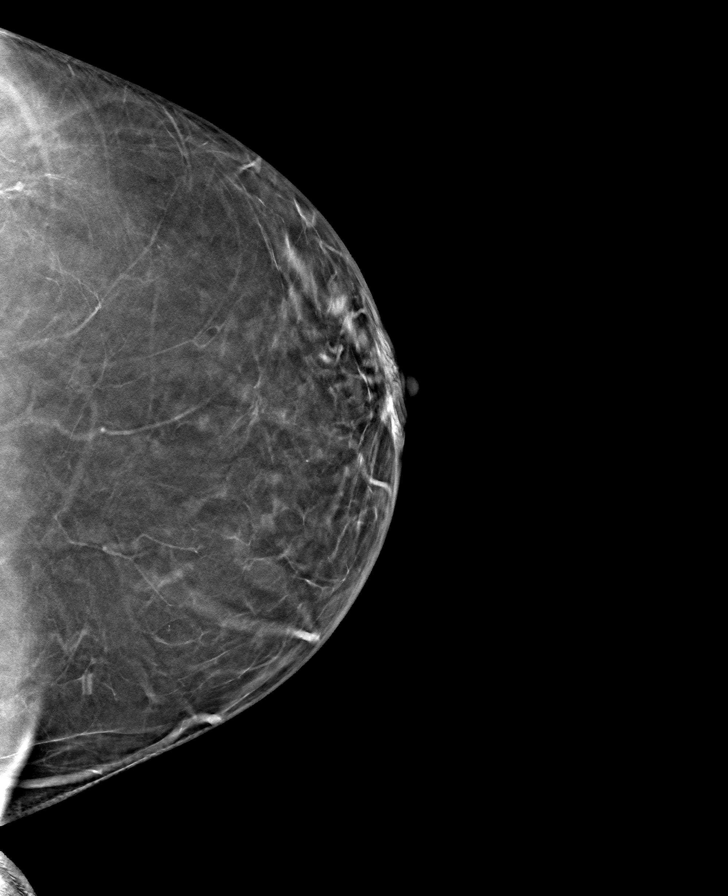

[R CC tomo · tomo slice 34/67.0]
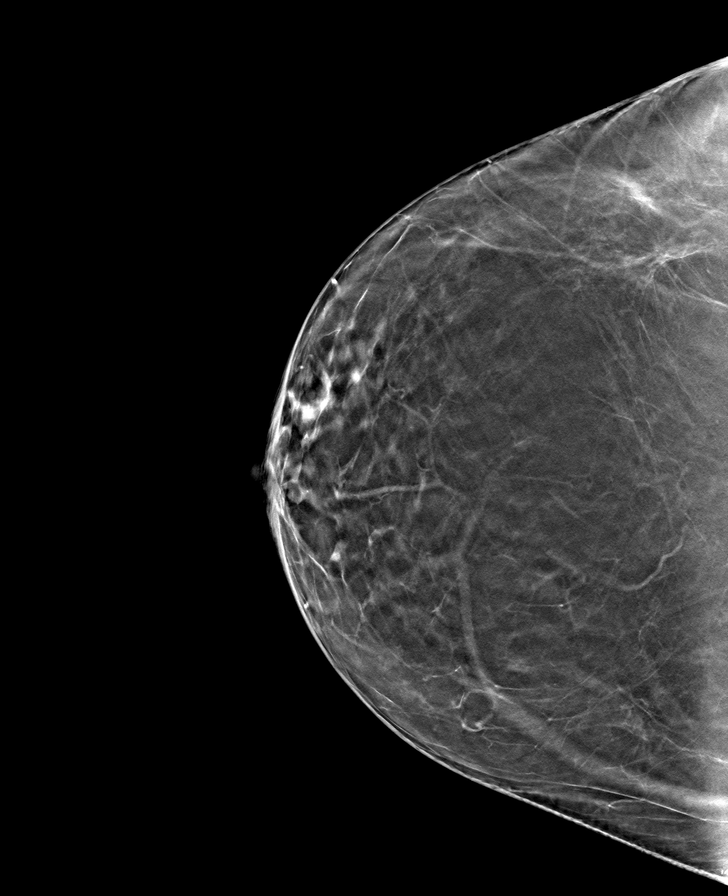

[R MLO tomo · tomo slice 47/92.0]
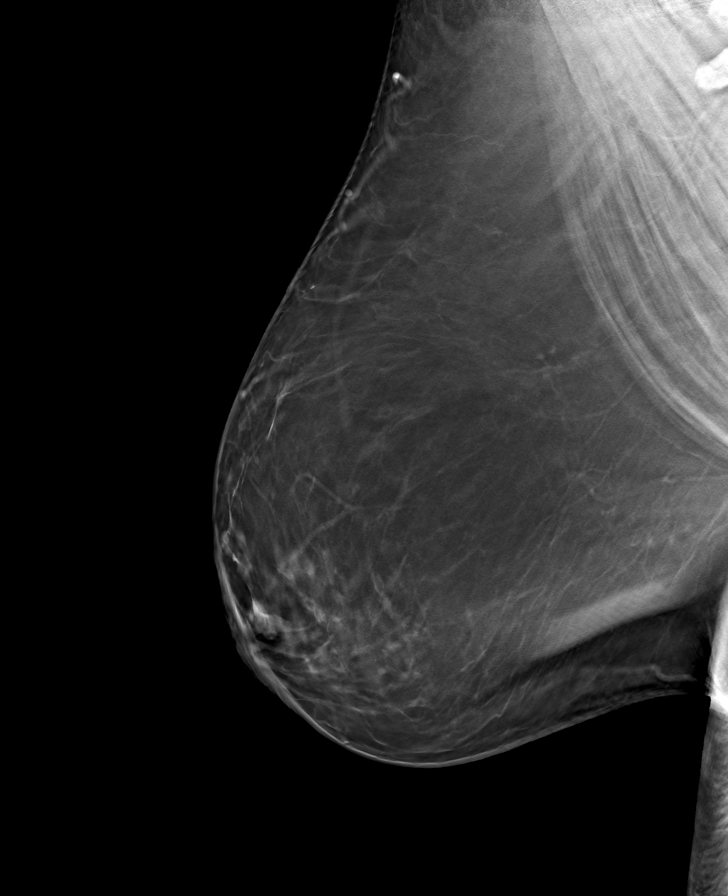

[L MLO tomo · tomo slice 45/90.0]
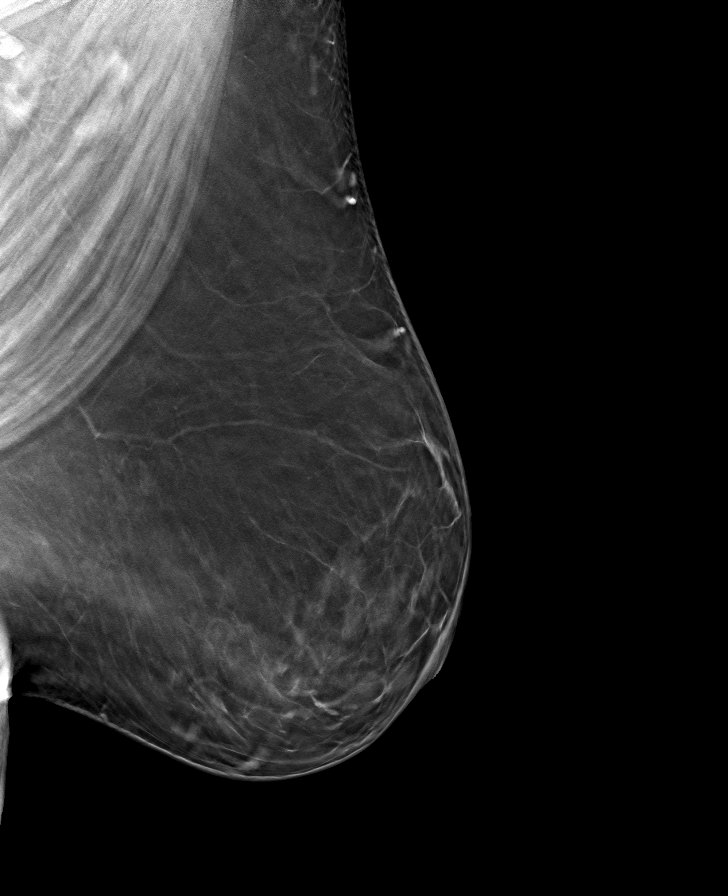

[8 of 24 positions shown; findings below may reference images not displayed]

ACR Breast Density Category b: There are scattered areas of
fibroglandular density.
FINDINGS: There are no findings suspicious for malignancy.
IMPRESSION: No mammographic evidence of malignancy. A result letter of this
screening mammogram will be mailed directly to the patient.

RECOMMENDATION:
Screening mammogram in one year. (Code:51-O-LD2)

BI-RADS CATEGORY  1: Negative.

## 2024-04-06 ENCOUNTER — Emergency Department

## 2024-04-06 ENCOUNTER — Emergency Department
Admission: EM | Admit: 2024-04-06 | Discharge: 2024-04-06 | Disposition: A | Discharge status: Home / Self Care | Attending: Emergency Medicine | Admitting: Emergency Medicine

## 2024-04-06 ENCOUNTER — Other Ambulatory Visit: Payer: Self-pay

## 2024-04-06 DIAGNOSIS — S76211A Strain of adductor muscle, fascia and tendon of right thigh, initial encounter: Secondary | ICD-10-CM | POA: Diagnosis not present

## 2024-04-06 DIAGNOSIS — T148XXA Other injury of unspecified body region, initial encounter: Secondary | ICD-10-CM

## 2024-04-06 DIAGNOSIS — W182XXA Fall in (into) shower or empty bathtub, initial encounter: Secondary | ICD-10-CM | POA: Diagnosis not present

## 2024-04-06 DIAGNOSIS — M79604 Pain in right leg: Secondary | ICD-10-CM | POA: Diagnosis present

## 2024-04-06 MED ORDER — TRAMADOL HCL 50 MG PO TABS
50.0000 mg | ORAL_TABLET | Freq: Once | ORAL | Status: DC
  Filled 2024-04-06: qty 1

## 2024-04-06 MED ORDER — TRAMADOL HCL 50 MG PO TABS: 50.0000 mg | ORAL_TABLET | Freq: Two times a day (BID) | ORAL | 0 refills | Status: AC | PRN

## 2024-04-06 NOTE — ED Triage Notes (Signed)
 Pt to ED via POV from home. Pt rpeorts had a fall in the shower on 9/14. EMS responded but pt was not transported to hospital. Pt reports continued and worsening right leg/groin pain. NO LOC. NO head trauma. No blood thinners.

## 2024-04-06 NOTE — ED Provider Notes (Signed)
 [facility] Provider Note    Event Date/Time   First MD Initiated Contact with Patient 04/06/24 [phone]     (approximate)   History   Fall   HPI  Joan Moody is a 63 y.o. female who presents today for evaluation of right groin pain.  Patient reports that approximately 8 days ago she slipped in the shower and did the splits.  She reports that she has had pain ever since.  Denies numbness or tingling.  She has been able to ambulate with a walker.  No head strike or LOC.  No other injury sustained.  There are no active problems to display for this patient.         Physical Exam   Triage Vital Signs: ED Triage Vitals  Encounter Vitals Group     BP 04/06/24 0949 121/72     Girls Systolic BP Percentile --      Girls Diastolic BP Percentile --      Boys Systolic BP Percentile --      Boys Diastolic BP Percentile --      Pulse Rate 04/06/24 0947 75     Resp 04/06/24 0947 18     Temp 04/06/24 0947 98.4 F (36.9 C)     Temp Source 04/06/24 0947 Oral     SpO2 04/06/24 0947 98 %     Weight --      Height --      Head Circumference --      Peak Flow --      Pain Score 04/06/24 0948 9     Pain Loc --      Pain Education --      Exclude from Growth Chart --     Most recent vital signs: Vitals:   04/06/24 1005 04/06/24 1255  BP:  123/64  Pulse:  74  Resp:  18  Temp:    SpO2: 100% 97%    Physical Exam Vitals and nursing note reviewed.  Constitutional:      General: Awake and alert. No acute distress.    Appearance: Normal appearance.   HENT:     Head: Normocephalic and atraumatic.     Mouth: Mucous membranes are moist.  Eyes:     General: PERRL. Normal EOMs        Right eye: No discharge.        Left eye: No discharge.     Conjunctiva/sclera: Conjunctivae normal.  Cardiovascular:     Rate and Rhythm: Normal rate and regular rhythm.     Pulses: Normal pulses.  Pulmonary:     Effort: Pulmonary effort is normal. No respiratory distress.      Breath sounds: Normal breath sounds.  Abdominal:     Abdomen is soft. There is no abdominal tenderness. No rebound or guarding. No distention. Musculoskeletal:        General: No swelling. Normal range of motion.     Cervical back: Normal range of motion and neck supple.  Able to externally and internally rotate bilateral hips against resistance.  Able to flex and extend bilateral hips against resistance, no pain with flexion of the right hip.  No skin changes noted.  Normal pedal pulses.  Normal range of motion of knee and ankle.  No lateral hip tenderness. Skin:    General: Skin is warm and dry.     Capillary Refill: Capillary refill takes less than 2 seconds.     Findings: No rash.  Neurological:  Mental Status: The patient is awake and alert.      ED Results / Procedures / Treatments   Labs (all labs ordered are listed, but only abnormal results are displayed) Labs Reviewed - No data to display   EKG     RADIOLOGY I independently reviewed and interpreted imaging and agree with radiologists findings.     PROCEDURES:  Critical Care performed:   Procedures   MEDICATIONS ORDERED IN ED: Medications  traMADol  (ULTRAM ) tablet 50 mg (50 mg Oral Not Given 04/06/24 1110)     IMPRESSION / MDM / ASSESSMENT AND PLAN / ED COURSE  I reviewed the triage vital signs and the nursing notes.   Differential diagnosis includes, but is not limited to, fracture, muscle strain, muscle tear.  Patient is awake and alert, hemodynamically stable and afebrile.  She is neurovascularly intact with normal pedal pulses bilaterally, foot is warm and well-perfused bilaterally, no paresthesias throughout her extremity, do not suspect vascular etiology.  X-ray of her hip was suspicious for a possible occult fracture, CT scan obtained for further evaluation.  CT scan reveals no acute osseous injuries, though does reveal an intramuscular hematoma and partial muscle tear of the right adductor  longus muscle belly.  These findings were discussed with Dr. Tobie with orthopedic surgery on-call who feels that there is nothing to do for this.  Recommended symptomatic management and outpatient follow-up in 1 week if symptoms persist.  Patient is able to ambulate with a walker and feels comfortable doing so.  She was offered analgesia while she was in the emergency department but declined, they requested that she go home with.  She has been taking Tylenol/ibuprofen per package instructions at home, and I recommended that she continue to take this.  She was offered a Lidoderm  patch but declined.  She was given a prescription for tramadol  though advised that this medication is highly addictive additionally be used for breakthrough pain when Tylenol and ibuprofen do not work first.  Also advised that she cannot drive, operate heavy machinery, or perform a test that require concentration while taking this medication.  We discussed return precautions in the meantime.  Patient understands and agrees with plan.  She was discharged in stable condition.   Patient's presentation is most consistent with acute complicated illness / injury requiring diagnostic workup.   Clinical Course as of 04/06/24 1357  Mon Apr 06, 2024  1231 Ortho paged [JP]  1235 Discussed with Dr. Tobie with orthopedics who feels that there is nothing to do for this, though she may follow-up with them in 1 week if her symptoms persist [JP]    Clinical Course User Index [JP] Avalene Sealy E, PA-C     FINAL CLINICAL IMPRESSION(S) / ED DIAGNOSES   Final diagnoses:  Muscle tear  Intramuscular hematoma     Rx / DC Orders   ED Discharge Orders          Ordered    traMADol  (ULTRAM ) 50 MG tablet  Every 12 hours PRN        04/06/24 1244             Note:  This document was prepared using Dragon voice recognition software and may include unintentional dictation errors.   Haadiya Frogge E, PA-C 04/06/24 1357    Suzanne Kirsch, MD 04/06/24 [phone]

## 2024-04-06 NOTE — Discharge Instructions (Signed)
 You may do the tramadol  as needed for breakthrough pain.  Remember that this medication is highly addictive and should only be used for breakthrough pain.  Also remember you cannot drive, operate heavy machinery, or perform any tasks that require concentration while taking this medication.  Please follow-up with orthopedics if your symptoms persist.  It was a pleasure caring for you today.

## 2024-04-06 NOTE — ED Notes (Signed)
 Pt advised she would like to stay in her wheelchair for her comfort. Arm band placed and pt wearing shoes. Will place chair alarm if pt needs to get up from chair. Pt instructed not to get up without calling for help first. Family sitting with pt.

## 2024-04-06 NOTE — ED Notes (Signed)
 Pt inquiring about her results from Xray and CT. I informed her that everything has just resulted and that the provider when go over results with her once she reads the results.

## 2024-04-06 NOTE — ED Notes (Signed)
 Pt advised she does not need any pain meds at this time.

## 2024-04-06 NOTE — ED Notes (Signed)
 Pt states they feel more comfortable to stay in wheelchair rather than stretcher.
